# Patient Record
Sex: Female | Born: 1980 | Race: White | Hispanic: No | Marital: Single | Smoking: Current every day smoker
Health system: Southern US, Community
[De-identification: ages and names within clinical notes are randomized; demographics above are authoritative.]

---

## 2017-10-20 ENCOUNTER — Encounter (HOSPITAL_COMMUNITY): Payer: Self-pay | Admitting: Emergency Medicine

## 2017-10-20 ENCOUNTER — Other Ambulatory Visit: Payer: Self-pay

## 2017-10-20 ENCOUNTER — Emergency Department (HOSPITAL_COMMUNITY): Payer: BLUE CROSS/BLUE SHIELD

## 2017-10-20 ENCOUNTER — Emergency Department (HOSPITAL_COMMUNITY)
Admission: EM | Admit: 2017-10-20 | Discharge: 2017-10-20 | Disposition: A | Discharge status: Home / Self Care | Payer: BLUE CROSS/BLUE SHIELD | Attending: Emergency Medicine | Admitting: Emergency Medicine

## 2017-10-20 DIAGNOSIS — F17210 Nicotine dependence, cigarettes, uncomplicated: Secondary | ICD-10-CM | POA: Diagnosis not present

## 2017-10-20 DIAGNOSIS — O209 Hemorrhage in early pregnancy, unspecified: Secondary | ICD-10-CM | POA: Diagnosis present

## 2017-10-20 DIAGNOSIS — O99331 Smoking (tobacco) complicating pregnancy, first trimester: Secondary | ICD-10-CM | POA: Diagnosis not present

## 2017-10-20 DIAGNOSIS — A599 Trichomoniasis, unspecified: Secondary | ICD-10-CM | POA: Diagnosis not present

## 2017-10-20 DIAGNOSIS — R102 Pelvic and perineal pain: Secondary | ICD-10-CM | POA: Insufficient documentation

## 2017-10-20 DIAGNOSIS — Z3201 Encounter for pregnancy test, result positive: Secondary | ICD-10-CM

## 2017-10-20 DIAGNOSIS — N939 Abnormal uterine and vaginal bleeding, unspecified: Secondary | ICD-10-CM

## 2017-10-20 LAB — URINALYSIS, ROUTINE W REFLEX MICROSCOPIC
BILIRUBIN URINE: NEGATIVE
Bacteria, UA: NONE SEEN
Glucose, UA: NEGATIVE mg/dL
Ketones, ur: NEGATIVE mg/dL
Nitrite: NEGATIVE
Protein, ur: 30 mg/dL — AB
SPECIFIC GRAVITY, URINE: 1.027 (ref 1.005–1.030)
pH: 5 (ref 5.0–8.0)

## 2017-10-20 LAB — CBC
HEMATOCRIT: 42.2 % (ref 36.0–46.0)
Hemoglobin: 13.7 g/dL (ref 12.0–15.0)
MCH: 29.2 pg (ref 26.0–34.0)
MCHC: 32.5 g/dL (ref 30.0–36.0)
MCV: 90 fL (ref 78.0–100.0)
Platelets: 280 10*3/uL (ref 150–400)
RBC: 4.69 MIL/uL (ref 3.87–5.11)
RDW: 13.6 % (ref 11.5–15.5)
WBC: 13.3 10*3/uL — AB (ref 4.0–10.5)

## 2017-10-20 LAB — COMPREHENSIVE METABOLIC PANEL
ALT: 19 U/L (ref 0–44)
AST: 16 U/L (ref 15–41)
Albumin: 3.8 g/dL (ref 3.5–5.0)
Alkaline Phosphatase: 76 U/L (ref 38–126)
Anion gap: 10 (ref 5–15)
BUN: 8 mg/dL (ref 6–20)
CHLORIDE: 103 mmol/L (ref 98–111)
CO2: 25 mmol/L (ref 22–32)
Calcium: 8.9 mg/dL (ref 8.9–10.3)
Creatinine, Ser: 0.81 mg/dL (ref 0.44–1.00)
GFR calc Af Amer: >60 mL/min (ref >60)
GFR calc non Af Amer: >60 mL/min (ref >60)
GLUCOSE: 152 mg/dL — AB (ref 70–99)
POTASSIUM: 3.6 mmol/L (ref 3.5–5.1)
Sodium: 138 mmol/L (ref 135–145)
Total Bilirubin: 0.5 mg/dL (ref 0.3–1.2)
Total Protein: 6.7 g/dL (ref 6.5–8.1)

## 2017-10-20 LAB — WET PREP, GENITAL
Clue Cells Wet Prep HPF POC: NONE SEEN
SPERM: NONE SEEN

## 2017-10-20 LAB — I-STAT BETA HCG BLOOD, ED (MC, WL, AP ONLY): HCG, QUANTITATIVE: 54.9 m[IU]/mL — AB (ref <5)

## 2017-10-20 MED ORDER — METRONIDAZOLE 500 MG PO TABS
2000.0000 mg | ORAL_TABLET | Freq: Once | ORAL | Status: AC
  Administered 2017-10-20: 2000 mg via ORAL
  Filled 2017-10-20: qty 4

## 2017-10-20 NOTE — ED Notes (Signed)
Transported to [place] via stretcher

## 2017-10-20 NOTE — ED Notes (Signed)
Reports spotting at onset of menses as opposed to normal onset of vaginal bleeding pattern - states lasted approx 2 days then began with abd cramping - went to UC today for eval of same - was told she was pregnant and subsequently sent to ED for further eval of same; P1 G2 Ab0

## 2017-10-20 NOTE — ED Provider Notes (Signed)
[facility] EMERGENCY DEPARTMENT Provider Note   CSN: [id number] Arrival date & time: 10/20/17  1606     History   Chief Complaint Chief Complaint  Patient presents with  . Vaginal Bleeding  . Abdominal Pain    HPI Kristy Romero is a 37 y.o. female.  The history is provided by the patient and medical records. No language interpreter was used.  Vaginal Bleeding  Primary symptoms include vaginal bleeding.  Primary symptoms include no dysuria. Associated symptoms include abdominal pain. Pertinent negatives include no diarrhea, no nausea, no vomiting and no frequency.  Abdominal Pain   Pertinent negatives include diarrhea, nausea, vomiting, dysuria and frequency.   Kristy Romero is a 37 y.o. female  with no known PMH who presents to the Emergency Department from urgent care for abdominal pain and positive urine pregnancy test today.  Patient states that she started experiencing cramping lower abdominal pain about 2 to 3 days ago.  She thought this was her usual menstrual cycle cramping.  She also has had vaginal spotting.  She reports that this is very different from her usual menstrual cycles that she usually will have one day of spotting and then a very heavy flow following day.  She has never had 3 days of spotting before.  She does endorse vaginal discharge as well.  No nausea, vomiting, fever, back pain, dysuria, chest pain or shortness of breath.  No medications taken prior to arrival for symptoms.  She is sexually active with her partner, however did have a "weekend feeling" recently with a new partner.  History reviewed. No pertinent past medical history.  There are no active problems to display for this patient.   History reviewed. No pertinent surgical history.   OB History    Gravida  1   Para      Term      Preterm      AB      Living        SAB      TAB      Ectopic      Multiple      Live Births               Home Medications     Prior to Admission medications   Not on File    Family History No family history on file.  Social History Social History   Tobacco Use  . Smoking status: Current Every Day Smoker    Types: Cigarettes  Substance Use Topics  . Alcohol use: Not Currently  . Drug use: Yes    Types: Marijuana     Allergies   Patient has no known allergies.   Review of Systems Review of Systems  Gastrointestinal: Positive for abdominal pain. Negative for diarrhea, nausea and vomiting.  Genitourinary: Positive for vaginal bleeding and vaginal discharge. Negative for dysuria and frequency.  All other systems reviewed and are negative.    Physical Exam Updated Vital Signs BP 121/70 (BP Location: Right Arm)   Pulse 84   Temp 97.8 F (36.6 C) (Oral)   Resp 19   Ht 5\' 5"  (1.651 m)   Wt (!) 144.7 kg   LMP 09/17/2017   SpO2 98%   BMI 53.08 kg/m   Physical Exam  Constitutional: She is oriented to person, place, and time. She appears well-developed and well-nourished. No distress.  HENT:  Head: Normocephalic and atraumatic.  Cardiovascular: Normal rate, regular rhythm and normal heart sounds.  No murmur  heard. Pulmonary/Chest: Effort normal and breath sounds normal. No respiratory distress.  Abdominal: Soft. She exhibits no distension.  Very mild diffuse tenderness across the lower abdomen without rebound or guarding.  No CVA tenderness.  No focal tenderness at McBurney's.  Negative Murphy's.  Genitourinary:  Genitourinary Comments: Chaperone present for exam. + discharge. No CMT. No adnexal masses, tenderness, or fullness.  Small amount of bleeding within vaginal vault.  Neurological: She is alert and oriented to person, place, and time.  Skin: Skin is warm and dry.  Nursing note and vitals reviewed.    ED Treatments / Results  Labs (all labs ordered are listed, but only abnormal results are displayed) Labs Reviewed  WET PREP, GENITAL - Abnormal; Notable for the following  components:      Result Value   Yeast Wet Prep HPF POC NO CHARGE (*)    Trich, Wet Prep PRESENT (*)    WBC, Wet Prep HPF POC RARE (*)    All other components within normal limits  COMPREHENSIVE METABOLIC PANEL - Abnormal; Notable for the following components:   Glucose, Bld 152 (*)    All other components within normal limits  CBC - Abnormal; Notable for the following components:   WBC 13.3 (*)    All other components within normal limits  URINALYSIS, ROUTINE W REFLEX MICROSCOPIC - Abnormal; Notable for the following components:   APPearance CLOUDY (*)    Hgb urine dipstick LARGE (*)    Protein, ur 30 (*)    Leukocytes, UA MODERATE (*)    All other components within normal limits  I-STAT BETA HCG BLOOD, ED ([facility], [facility], [facility] ONLY) - Abnormal; Notable for the following components:   I-stat hCG, quantitative 54.9 (*)    All other components within normal limits  I-STAT BETA HCG BLOOD, ED ([facility], [facility], [facility] ONLY)  ABO/RH  GC/CHLAMYDIA PROBE AMP ([place]) NOT AT [facility]    EKG None  Radiology [place] Ob Comp < 14 Wks  Result Date: 10/20/2017 CLINICAL DATA:  Patient with pelvic cramping.  Vaginal bleeding. EXAM: OBSTETRIC <14 WK US AND TRANSVAGINAL OB US TECHNIQUE: Both transabdominal and transvaginal ultrasound examinations were performed for complete evaluation of the gestation as well as the maternal uterus, adnexal regions, and pelvic cul-de-sac. Transvaginal technique was performed to assess early pregnancy. COMPARISON:  None. FINDINGS: Intrauterine gestational sac: None Yolk sac:  Not Visualized. Embryo:  Not Visualized. Cardiac Activity: Not Visualized. Maternal uterus/adnexae: The right and left ovaries are not visualized. No adnexal masses identified. No free fluid in the pelvis. IMPRESSION: No intrauterine gestation identified. In the setting of positive pregnancy test and no definite intrauterine pregnancy, this reflects a pregnancy of unknown location. Differential considerations include early  normal IUP, abnormal IUP, or nonvisualized ectopic pregnancy. Differentiation is achieved with serial beta HCG supplemented by repeat sonography as clinically warranted. Electronically Signed   By: Annia Beltrew  Davis M.D.   On: 10/20/2017 19:02   [place] Ob Transvaginal  Result Date: 10/20/2017 CLINICAL DATA:  Patient with pelvic cramping.  Vaginal bleeding. EXAM: OBSTETRIC <14 WK US AND TRANSVAGINAL OB US TECHNIQUE: Both transabdominal and transvaginal ultrasound examinations were performed for complete evaluation of the gestation as well as the maternal uterus, adnexal regions, and pelvic cul-de-sac. Transvaginal technique was performed to assess early pregnancy. COMPARISON:  None. FINDINGS: Intrauterine gestational sac: None Yolk sac:  Not Visualized. Embryo:  Not Visualized. Cardiac Activity: Not Visualized. Maternal uterus/adnexae: The right and left ovaries are not visualized. No adnexal masses identified. No  free fluid in the pelvis. IMPRESSION: No intrauterine gestation identified. In the setting of positive pregnancy test and no definite intrauterine pregnancy, this reflects a pregnancy of unknown location. Differential considerations include early normal IUP, abnormal IUP, or nonvisualized ectopic pregnancy. Differentiation is achieved with serial beta HCG supplemented by repeat sonography as clinically warranted. Electronically Signed   By: Annia Belt M.D.   On: 10/20/2017 19:02    Procedures Procedures (including critical care time)  Medications Ordered in ED Medications  metroNIDAZOLE (FLAGYL) tablet 2,000 mg (2,000 mg Oral Given 10/20/17 1947)     Initial Impression / Assessment and Plan / ED Course  I have reviewed the triage vital signs and the nursing notes.  Pertinent labs & imaging results that were available during my care of the patient were reviewed by me and considered in my medical decision making (see chart for details).    Kristy Romero is a 37 y.o. female who presents to ED from  urgent care for lower abdominal pain and vaginal bleeding.  She had a positive urine pregnancy test at urgent care.  Her hCG today in the ER was elevated, however still quite low, at 54.9.  Ultrasound with no IUP identified, although given such low hCG, this could be early normal IUP, abnormal IUP or ectopic.  Will have patient follow-up with women's clinic in 2 days for reevaluation.  Wet prep was notable for trichomonas.  Treated with Flagyl in ED today.  Had a long discussion with patient about importance of disclosing diagnosis with her partner as he will need to be treated as well.  Reasons to return to the emergency department were discussed and all questions answered.  Patient discussed with Dr. Adela Lank who agrees with treatment plan.   Final Clinical Impressions(s) / ED Diagnoses   Final diagnoses:  Vaginal bleeding  Positive pregnancy test  Trichimoniasis    ED Discharge Orders    None       Dacie Mandel, Chase Picket, PA-C 10/20/17 2008    Floyd, Dan, DO 10/20/17 2324

## 2017-10-20 NOTE — Discharge Instructions (Signed)
It was my pleasure taking care of you today!   Please call the women's clinic listed to schedule a follow up appointment in 48-72 hours (Monday or Tuesday).   Please return to the ER for fevers, vomiting, new or worsening symptoms, any additional concerns.   You have been tested for chlamydia and gonorrhea. These results will be available in approximately 3 days. You will be notified if they are positive. As we discussed, you should notify all partners of today's trichomonas diagnosis.   It is very important to practice safe sex and use condoms when sexually active. If your results are positive you need to notify all sexual partners so they can be treated as well. The website https://[email or web address] can be used to send anonymous text messages or emails to alert sexual contacts.

## 2017-10-20 NOTE — ED Triage Notes (Signed)
Pt states he started spotting last week at her normal menstural time but didn't have a period and started having lower abdominal pain. States the pain was unbearable today and went to UC. Pt confirmed pregnant, approx 5-6 weeks. UC sent pt here, pt states she is having more bleeding now. 7/10 lower abdominal pain cramping

## 2017-10-22 ENCOUNTER — Ambulatory Visit (INDEPENDENT_AMBULATORY_CARE_PROVIDER_SITE_OTHER): Payer: BLUE CROSS/BLUE SHIELD

## 2017-10-22 DIAGNOSIS — O283 Abnormal ultrasonic finding on antenatal screening of mother: Secondary | ICD-10-CM

## 2017-10-22 DIAGNOSIS — O3680X0 Pregnancy with inconclusive fetal viability, not applicable or unspecified: Secondary | ICD-10-CM

## 2017-10-22 LAB — GC/CHLAMYDIA PROBE AMP (~~LOC~~) NOT AT ARMC
Chlamydia: POSITIVE — AB
Neisseria Gonorrhea: POSITIVE — AB

## 2017-10-22 LAB — HCG, QUANTITATIVE, PREGNANCY: hCG, Beta Chain, Quant, S: 35 m[IU]/mL — ABNORMAL HIGH (ref <5)

## 2017-10-22 NOTE — Progress Notes (Signed)
Pt here today for STAT beta lab.  Pt reports having vaginal bleeding that started this am.  She states that she has had to change her pad x 3.  She also states that she is having continuing back pain and intermittent lower abdominal cramping.  Pt informed to be here for at least two hours until we get results for f/u.  Pt stated understanding with no further questions. Notified Dr. Adrian BlackwaterStinson pt's results.  Provider recommendation to have pt come in 2-3 weeks for f/u for SAB with a provider.  Notified pt of provider's recommendation and to make sure that she monitors for increased heavy bleeding that saturates a pad an hour and increase in pain.  Pt stated understanding with no further questions.

## 2017-10-23 NOTE — Progress Notes (Signed)
Chart reviewed - agree with RN documentation.   

## 2017-10-25 ENCOUNTER — Ambulatory Visit (INDEPENDENT_AMBULATORY_CARE_PROVIDER_SITE_OTHER): Payer: BLUE CROSS/BLUE SHIELD | Admitting: General Practice

## 2017-10-25 ENCOUNTER — Encounter: Payer: Self-pay | Admitting: General Practice

## 2017-10-25 VITALS — BP 134/77 | HR 98 | Ht 65.0 in | Wt 322.0 lb

## 2017-10-25 DIAGNOSIS — A749 Chlamydial infection, unspecified: Secondary | ICD-10-CM

## 2017-10-25 DIAGNOSIS — A549 Gonococcal infection, unspecified: Secondary | ICD-10-CM

## 2017-10-25 MED ORDER — CEFTRIAXONE SODIUM 250 MG IJ SOLR
250.0000 mg | Freq: Once | INTRAMUSCULAR | Status: AC
  Administered 2017-10-25: 250 mg via INTRAMUSCULAR

## 2017-10-25 MED ORDER — AZITHROMYCIN 250 MG PO TABS
1000.0000 mg | ORAL_TABLET | Freq: Once | ORAL | Status: AC
  Administered 2017-10-25: 1000 mg via ORAL

## 2017-10-25 NOTE — Progress Notes (Signed)
Patient presents to the office today for STD treatment. Rocephin & Zithromax given per protocol & administered without complication. Patient will follow up in 4 weeks for TOC. [facility] notified via voicemail.

## 2017-10-26 NOTE — Progress Notes (Signed)
I have reviewed the chart and agree with nursing staff's documentation of this patient's encounter.  Vonzella NippleJulie Wenzel, PA-C 10/26/2017 8:05 AM

## 2017-11-01 ENCOUNTER — Telehealth: Payer: Self-pay | Admitting: General Practice

## 2017-11-01 NOTE — Telephone Encounter (Signed)
Patient called & left message on nurse voicemail line stating only her name & birthday. Called patient, no answer- unable to leave message as voicemail is not set up.

## 2017-11-02 ENCOUNTER — Telehealth: Payer: Self-pay

## 2017-11-02 ENCOUNTER — Other Ambulatory Visit: Payer: Self-pay

## 2017-11-02 DIAGNOSIS — N898 Other specified noninflammatory disorders of vagina: Secondary | ICD-10-CM

## 2017-11-02 MED ORDER — FLUCONAZOLE 150 MG PO TABS: 150.0000 mg | ORAL_TABLET | Freq: Once | ORAL | 0 refills | Status: AC

## 2017-11-02 NOTE — Telephone Encounter (Signed)
Called pt back, she stated that she has developed a yeast infection due to meds given. Pt requested medication, sent Diflucan 150 mg PO 1 dose to Pharmacy on file/ Pt verbalized understanding

## 2017-11-02 NOTE — Telephone Encounter (Signed)
Pt called on Nurse line 11/01/17 @ 4:37p stating that she was in a meeting when she missed our call.Requested call back @ [phone].

## 2017-11-05 ENCOUNTER — Ambulatory Visit (INDEPENDENT_AMBULATORY_CARE_PROVIDER_SITE_OTHER): Payer: BLUE CROSS/BLUE SHIELD | Admitting: Advanced Practice Midwife

## 2017-11-05 ENCOUNTER — Encounter: Payer: Self-pay | Admitting: Advanced Practice Midwife

## 2017-11-05 VITALS — BP 138/94 | HR 93 | Ht 65.0 in | Wt 320.1 lb

## 2017-11-05 DIAGNOSIS — B3731 Acute candidiasis of vulva and vagina: Secondary | ICD-10-CM

## 2017-11-05 DIAGNOSIS — B373 Candidiasis of vulva and vagina: Secondary | ICD-10-CM

## 2017-11-05 DIAGNOSIS — O039 Complete or unspecified spontaneous abortion without complication: Secondary | ICD-10-CM | POA: Diagnosis not present

## 2017-11-05 MED ORDER — FLUCONAZOLE 150 MG PO TABS: 150.0000 mg | ORAL_TABLET | Freq: Once | ORAL | 0 refills | Status: AC

## 2017-11-05 NOTE — Progress Notes (Signed)
  Subjective:     Patient ID: Kristy Romero, female   DOB: Jan 27, 1981, 37 y.o.   MRN: [id number]  Brihany Butch is a 37 y.o. [phone] who is here today for SAB FU. She was seen in the ED on 10/20/17 and had hcg of 54.9, and no IUP seen on [place]. She was seen again 2 days later on 10/22/17 and hcg was 35. She was also found to be +GC/CT and trichomoniasis. She has been given treatment for these. She reports that she told her partner to get treatment, but she is unsure if he has. She has not had intercourse with him since the treatment. She reports that she has stopped bleeding. She called with discharge and itching last week. She was given diflucan. She states that she is still having some irritation. She is using OTC 7 day treatment.   Vaginal Bleeding  The patient's pertinent negatives include no pelvic pain or vaginal discharge. This is a new problem. The current episode started yesterday. The problem occurs intermittently. The problem has been unchanged. The pain is mild. She is not pregnant. The vaginal discharge was thick and white. There has been no bleeding. The symptoms are aggravated by urinating. She has tried antifungals for the symptoms.     Review of Systems  Genitourinary: Positive for vaginal bleeding. Negative for pelvic pain and vaginal discharge.       Objective:   Physical Exam  Constitutional: She appears well-developed and well-nourished. No distress.  HENT:  Head: Normocephalic.  Cardiovascular: Normal rate.  Pulmonary/Chest: Effort normal.  Abdominal: Soft.  Genitourinary:  Genitourinary Comments:  External: no lesion Vagina: moderate amount of thick, white discharge Cervix: pink, smooth, no CMT Uterus: NSSC Adnexa: NT   Skin: Skin is warm and dry.  Psychiatric: She has a normal mood and affect.  Nursing note and vitals reviewed.      Assessment:     1. SAB (spontaneous abortion)   2. Yeast infection involving the vagina and surrounding area        Plan:      BHCG today Yeast by clinical exam. Will give diflucan X 2 doses FU in 4-6 weeks for annual and TOC

## 2017-11-06 LAB — BETA HCG QUANT (REF LAB): hCG Quant: <1 m[IU]/mL

## 2020-04-25 IMAGING — US US OB TRANSVAGINAL
1 series · 14 of 28 positions shown · non-contrast
Comparison: None.

CLINICAL DATA: Patient with pelvic cramping.  Vaginal bleeding.

EXAM:
OBSTETRIC <14 WK US AND TRANSVAGINAL OB US
TECHNIQUE: Both transabdominal and transvaginal ultrasound examinations were
performed for complete evaluation of the gestation as well as the
maternal uterus, adnexal regions, and pelvic cul-de-sac.
Transvaginal technique was performed to assess early pregnancy.

[Series 1: us ob transvaginal · 0.30mm/px · 14 of 59 slices shown]
[im 3/59]
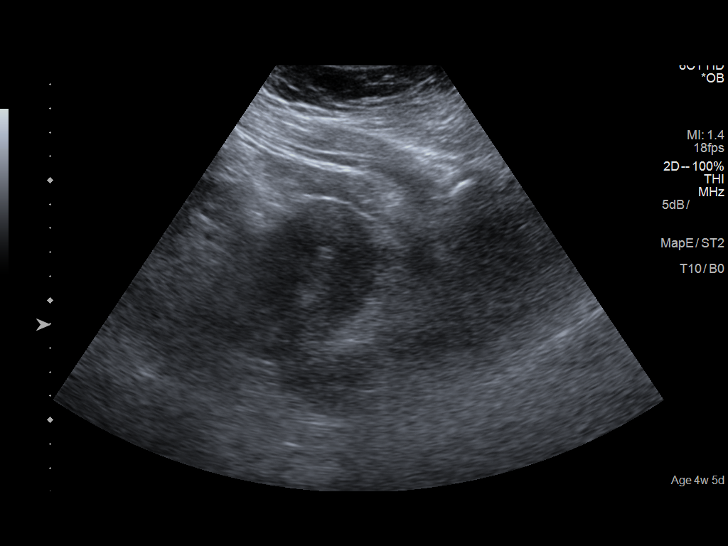
[im 7/59]
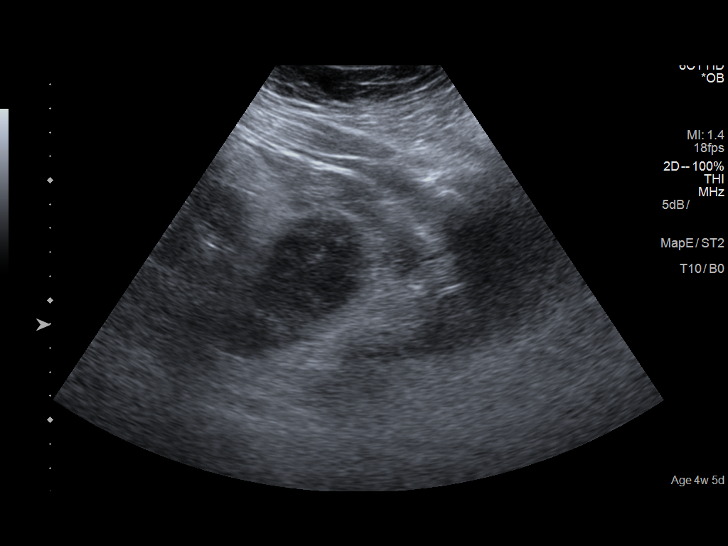
[im 11/59]
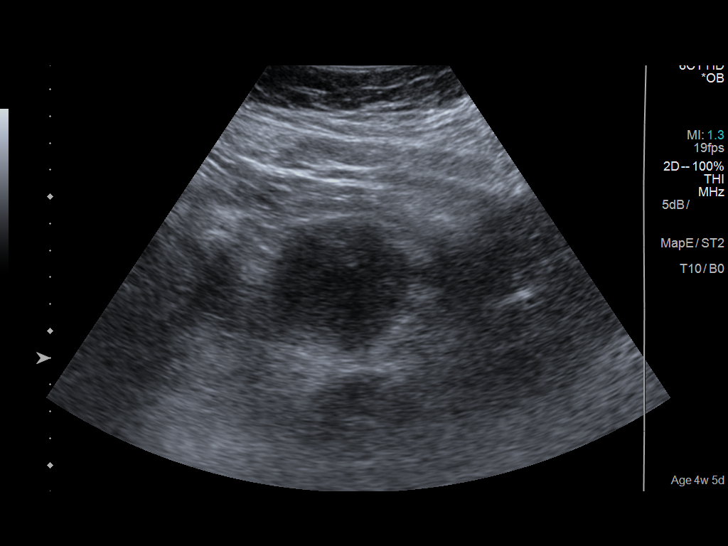
[im 16/59]
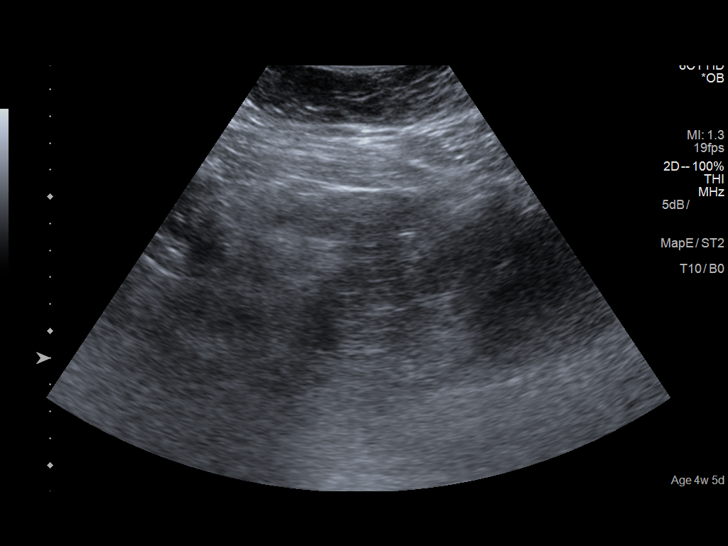
[im 20/59]
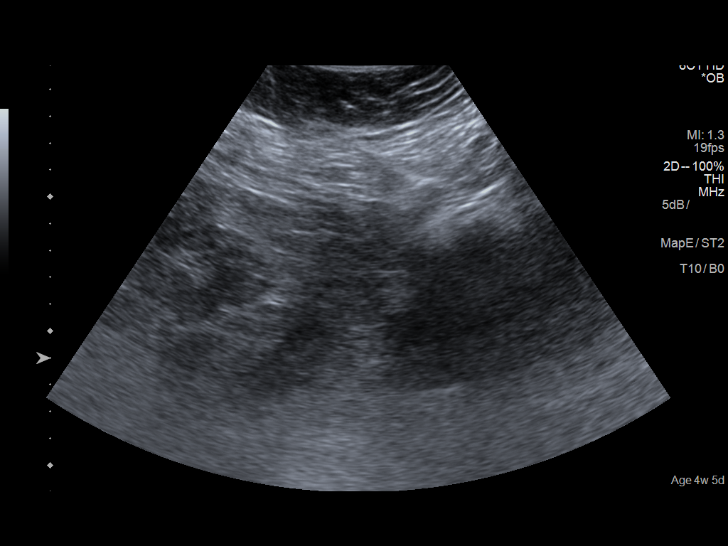
[im 24/59]
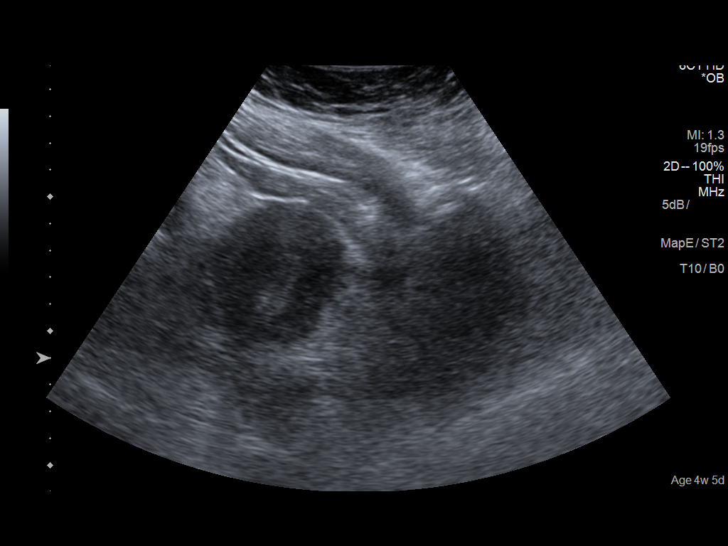
[im 28/59]
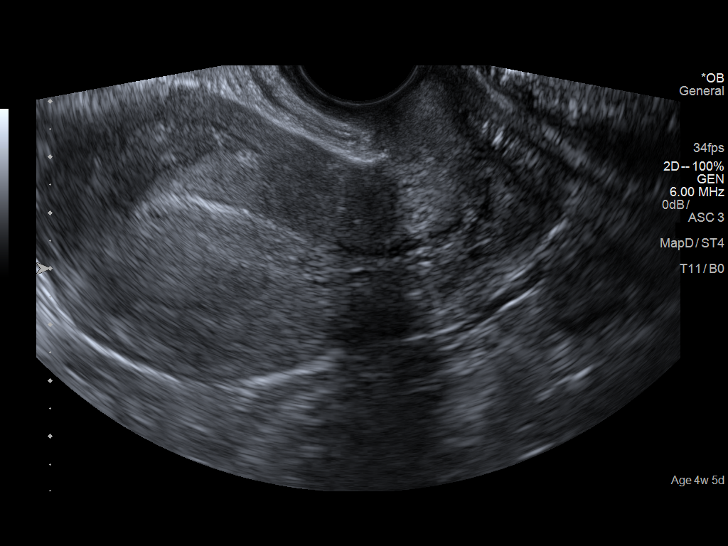
[im 33/59]
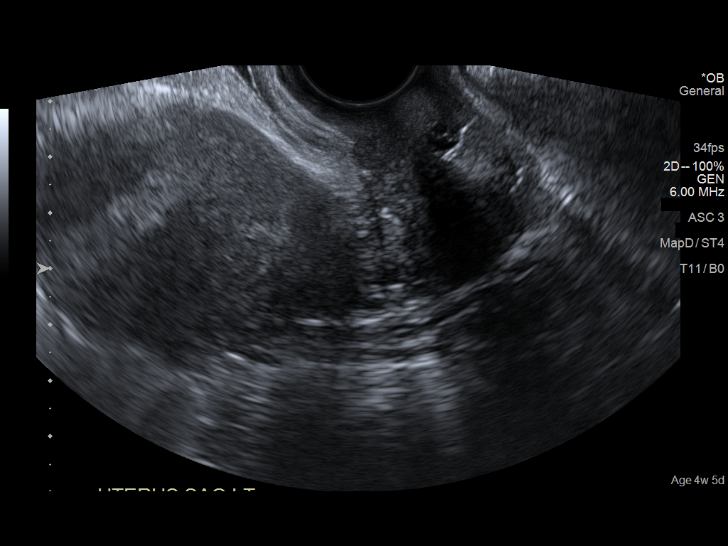
[im 37/59]
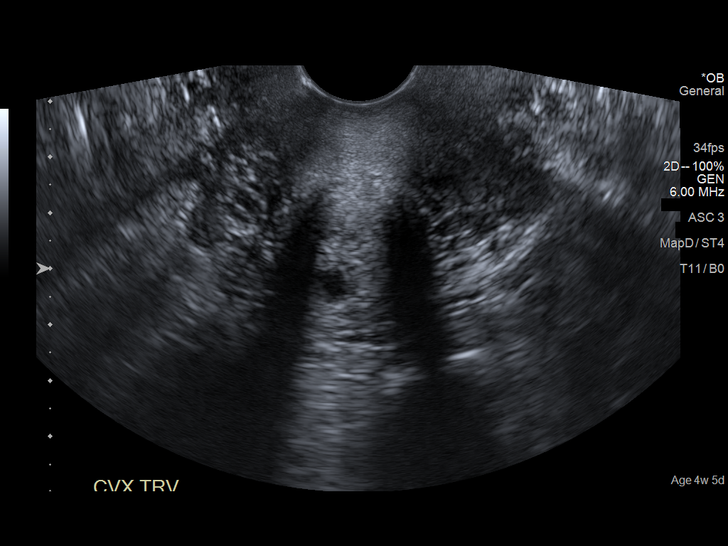
[im 41/59]
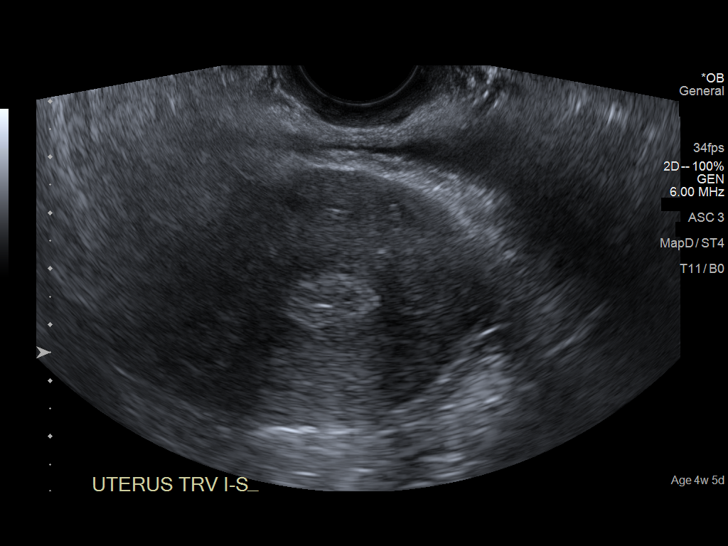
[im 46/59]
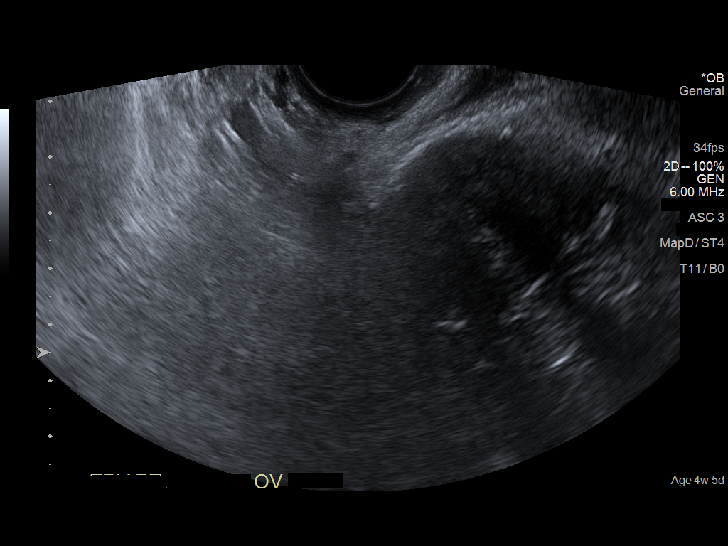
[im 50/59]
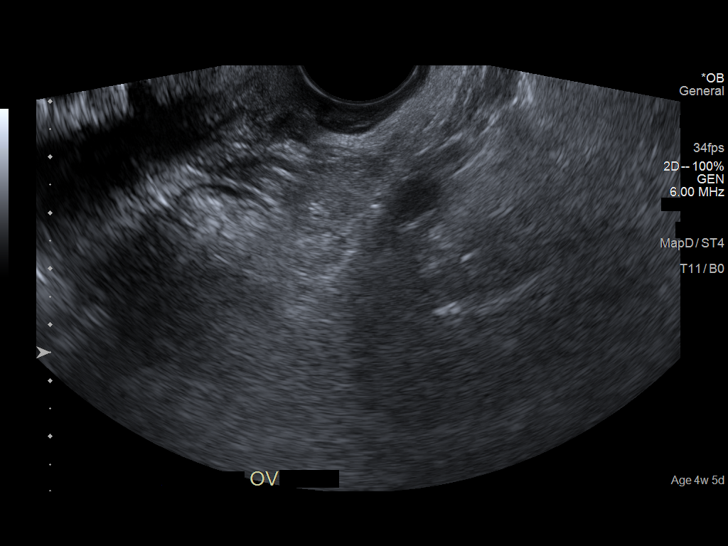
[im 54/59]
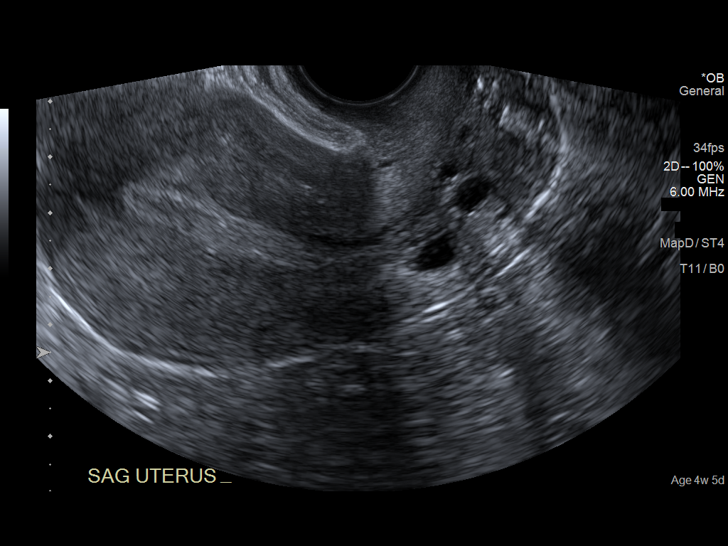
[im 59/59]
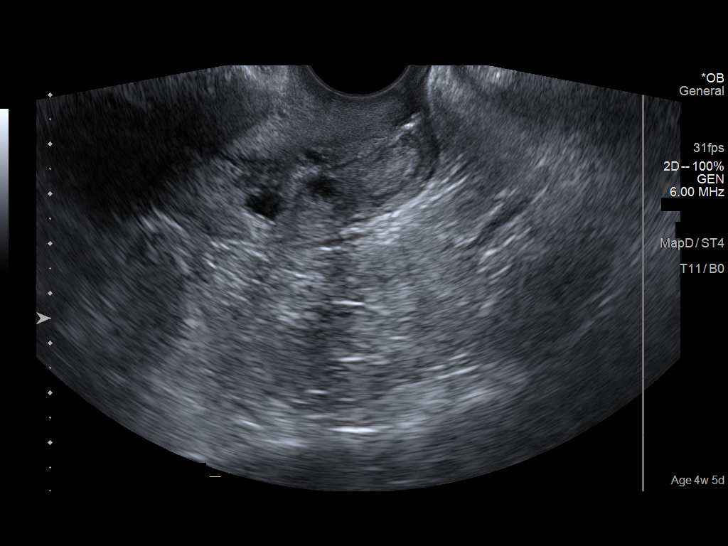

[14 of 28 positions shown; findings below may reference images not displayed]

FINDINGS: Intrauterine gestational sac: None

Yolk sac:  Not Visualized.

Embryo:  Not Visualized.

Cardiac Activity: Not Visualized.

Maternal uterus/adnexae: The right and left ovaries are not
visualized. No adnexal masses identified. No free fluid in the
pelvis.
IMPRESSION: No intrauterine gestation identified. In the setting of positive
pregnancy test and no definite intrauterine pregnancy, this reflects
a pregnancy of unknown location. Differential considerations include
early normal IUP, abnormal IUP, or nonvisualized ectopic pregnancy.
Differentiation is achieved with serial beta HCG supplemented by
repeat sonography as clinically warranted.

## 2024-02-04 ENCOUNTER — Other Ambulatory Visit: Payer: Self-pay

## 2024-02-04 ENCOUNTER — Encounter (HOSPITAL_BASED_OUTPATIENT_CLINIC_OR_DEPARTMENT_OTHER): Payer: Self-pay | Admitting: Emergency Medicine

## 2024-02-04 ENCOUNTER — Emergency Department (HOSPITAL_BASED_OUTPATIENT_CLINIC_OR_DEPARTMENT_OTHER)
Admission: EM | Admit: 2024-02-04 | Discharge: 2024-02-04 | Disposition: A | Discharge status: Home / Self Care | Attending: Emergency Medicine | Admitting: Emergency Medicine

## 2024-02-04 DIAGNOSIS — R519 Headache, unspecified: Secondary | ICD-10-CM | POA: Diagnosis present

## 2024-02-04 DIAGNOSIS — J101 Influenza due to other identified influenza virus with other respiratory manifestations: Secondary | ICD-10-CM | POA: Diagnosis not present

## 2024-02-04 NOTE — ED Triage Notes (Signed)
 Reports headache, lack of appetite, and nausea. Reports family tested positive for flu A. Requesting swab for work.

## 2024-02-04 NOTE — Discharge Instructions (Addendum)
 You were seen in the ER today for concerns of influenza. With your recent exposures, you are assumed to have influenza with the rise of the new flu variant we are seeing. Please avoid contact with others for the next several days to reduce spread of this infection.Take Tylenol and ibuprofen for fever and bodyaches and make sure you are staying well hydrated. For any concerns of worsening symptoms, return to the ER.

## 2024-02-06 NOTE — ED Provider Notes (Signed)
 " [place] EMERGENCY DEPARTMENT AT [facility] Provider Note   CSN: [id number] Arrival date & time: 02/04/24  1224     Patient presents with: Headache   Kristy Romero is a 43 y.o. female. Patient presents to the ED today with concerns of a headache, nausea, and poor appetite.  Reportedly is been around family who has tested positive for flu a recently.  She is requesting to be tested for influenza.  She denies any chest pain or shortness of breath.  She denies any nausea, vomiting, or other generalized weakness.  She states that she is primarily here for testing due to work reasons.   Headache      Prior to Admission medications  Not on File    Allergies: Patient has no known allergies.    Review of Systems  Neurological:  Positive for headaches.  All other systems reviewed and are negative.   Updated Vital Signs BP (!) 168/112 (BP Location: Right Arm)   Pulse 88   Temp 98.2 F (36.8 C)   Resp 18   SpO2 98%   Physical Exam Vitals and nursing note reviewed.  Constitutional:      General: She is not in acute distress.    Appearance: She is well-developed.  HENT:     Head: Normocephalic and atraumatic.  Eyes:     Conjunctiva/sclera: Conjunctivae normal.  Cardiovascular:     Rate and Rhythm: Normal rate and regular rhythm.     Heart sounds: No murmur heard. Pulmonary:     Effort: Pulmonary effort is normal. No respiratory distress.     Breath sounds: Normal breath sounds.  Abdominal:     Palpations: Abdomen is soft.     Tenderness: There is no abdominal tenderness.  Musculoskeletal:        General: No swelling.     Cervical back: Neck supple.  Skin:    General: Skin is warm and dry.     Capillary Refill: Capillary refill takes less than 2 seconds.  Neurological:     Mental Status: She is alert.  Psychiatric:        Mood and Affect: Mood normal.     (all labs ordered are listed, but only abnormal results are displayed) Labs Reviewed - No data  to display  EKG: None  Radiology: No results found.   Procedures   Medications Ordered in the ED - No data to display                                  Medical Decision Making  This patient presents to the ED for concern of headache.  Differential diagnosis includes COVID-19, viral URI, flu Enza    Additional history obtained:  Additional history obtained from chart review   Problem List / ED Course:  Patient presents to the emergency department today with concerns of headache, poor appetite, nausea.  Reports family tested positive for influenza A recently.  She requesting testing for work.  She endorses cough, sore throat, fever, chills and bodyaches along with her headache.  No other sick contacts.  Her family*she is aware. Physical exam is unremarkable.  No abnormal heart or lung sounds.  Abdomen soft and nontender. Advised patient that due to limitations of testing at this time due to severity of current influenza wave, testing supplies is limited and although I do suspect she likely has a viral URI, cannot fully confirm without testing as  patient does not require admission at this time.  Advised that she can wait to see if testing with over-the-counter antigen test available at any local pharmacy.  Suspect this is likely flu Enza given her recent contact with family who is positive for influenza.  Discussed treatment options including antiviral therapy.  Will hold off on antiviral therapy. Will start symptomatic treatment.  She is otherwise stable for outpatient follow-up and return precautions discussed.  Discharged home in stable condition.   Social Determinants of Health:  None  Final diagnoses:  Influenza A    ED Discharge Orders     None          [name] LABOR, PA-C 02/06/24 2155    [name] LABOR, DO 02/11/24 [phone]  "
